# Patient Record
Sex: Male | Born: 1980 | Race: White | Hispanic: No | Marital: Married | Smoking: Never smoker
Health system: Southern US, Community
[De-identification: ages and names within clinical notes are randomized; demographics above are authoritative.]

---

## 2005-02-02 ENCOUNTER — Ambulatory Visit: Payer: Self-pay

## 2007-07-12 IMAGING — CT CT ABD-PELV W/ CM
1 of 2 series · 16 of 32 positions shown, 20 images · non-contrast
Comparison: none

REASON FOR EXAM: LLQ PAIN
COMMENTS:

[Series 2: abdomen · axial · 0.66mm/px · z∈[-619,-211]mm · 16 of 57 slices shown, 20 images]
[im 3/57  soft-tissue]
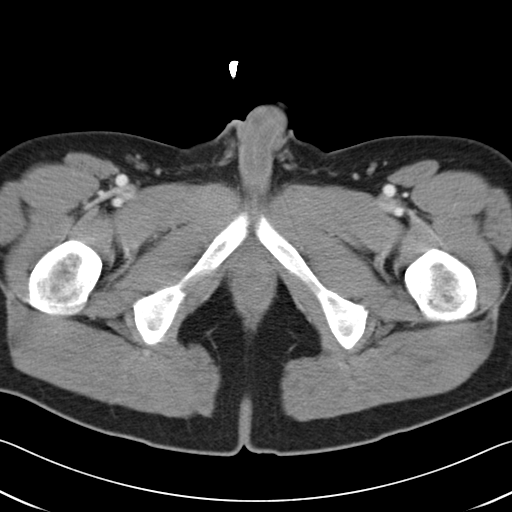
[im 3/57  bone]
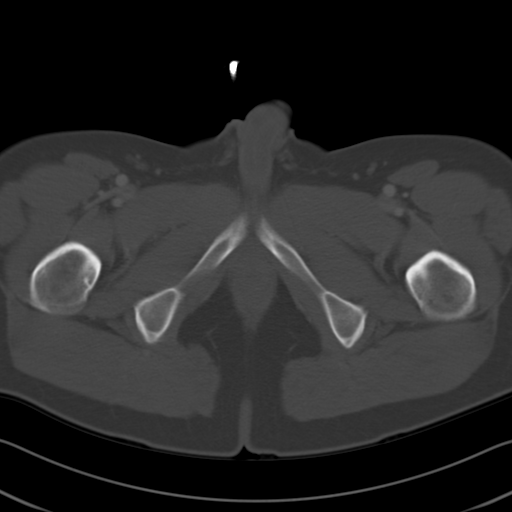
[im 7/57  soft-tissue]
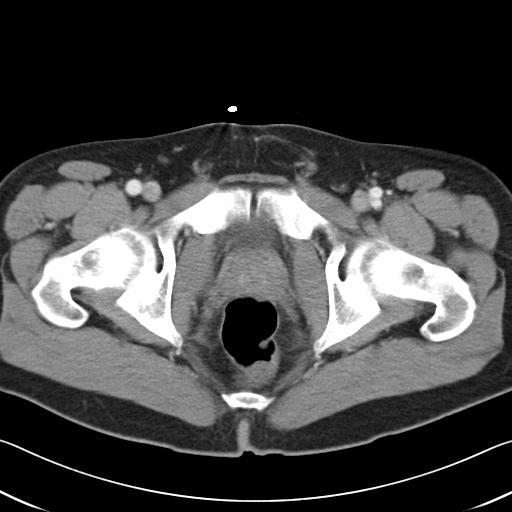
[im 12/57  soft-tissue]
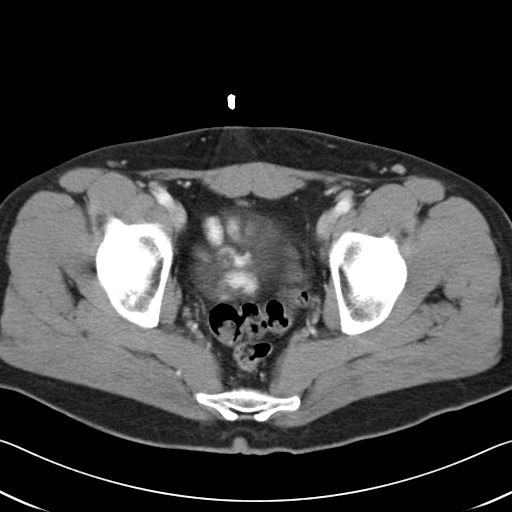
[im 16/57  soft-tissue]
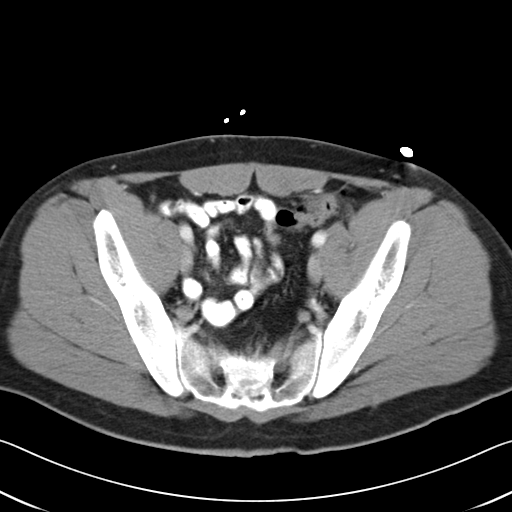
[im 18/57  soft-tissue]
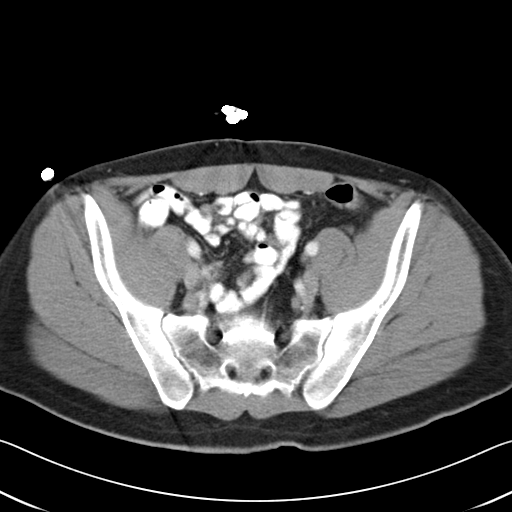
[im 23/57  soft-tissue]
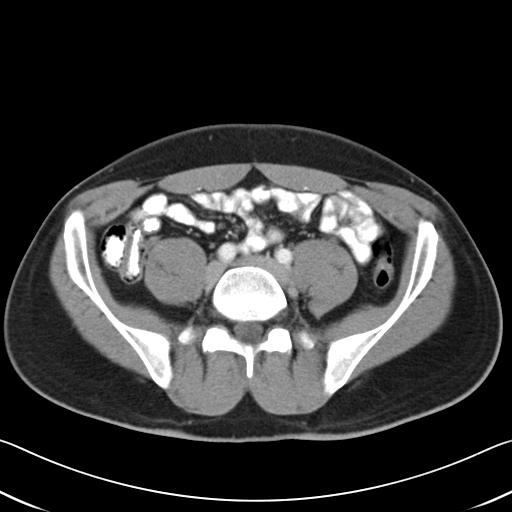
[im 27/57  soft-tissue]
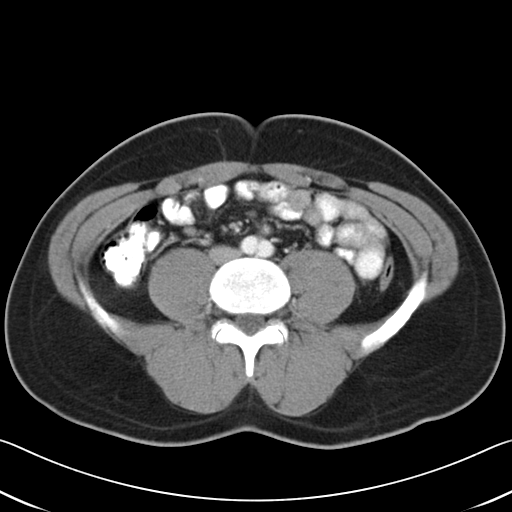
[im 30/57  soft-tissue]
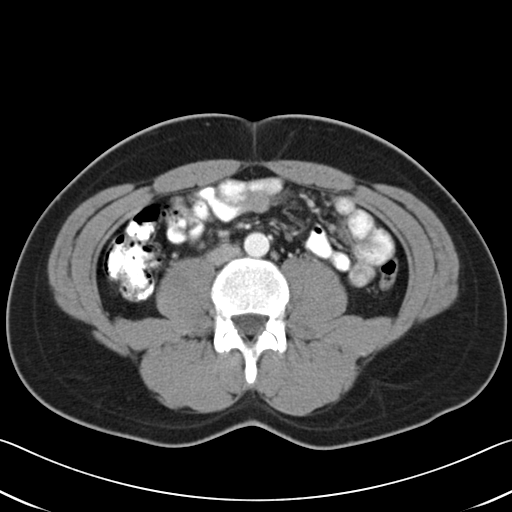
[im 34/57  soft-tissue]
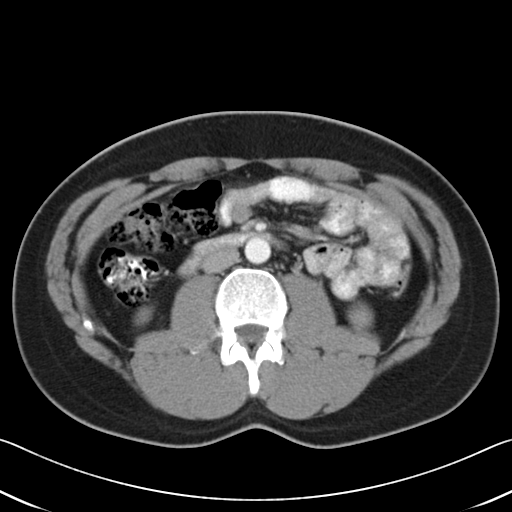
[im 34/57  bone]
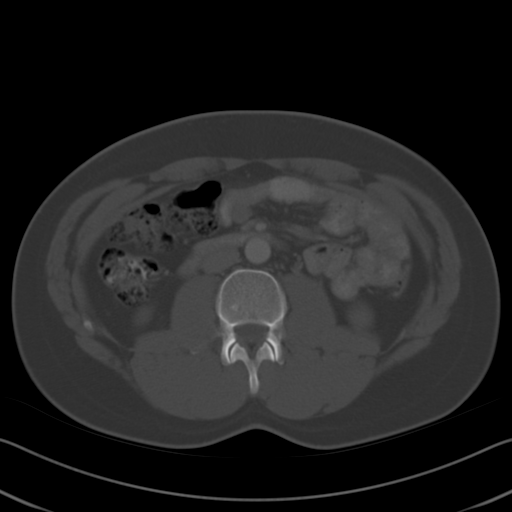
[im 39/57  soft-tissue]
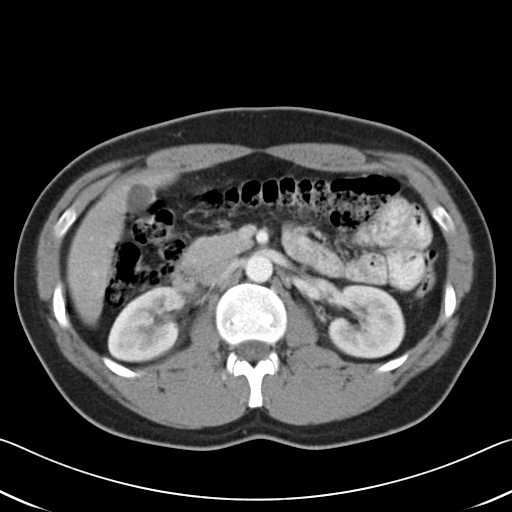
[im 43/57  soft-tissue]
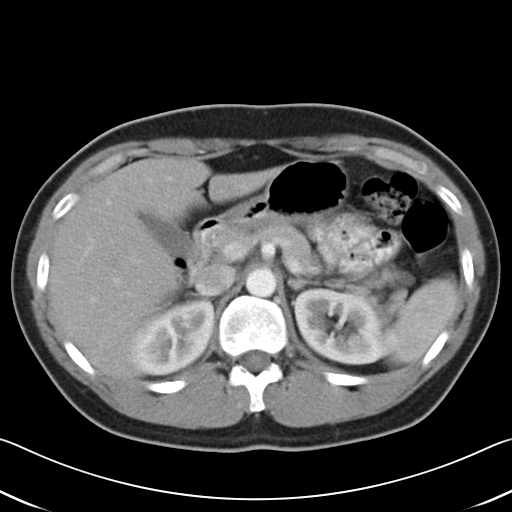
[im 45/57  soft-tissue]
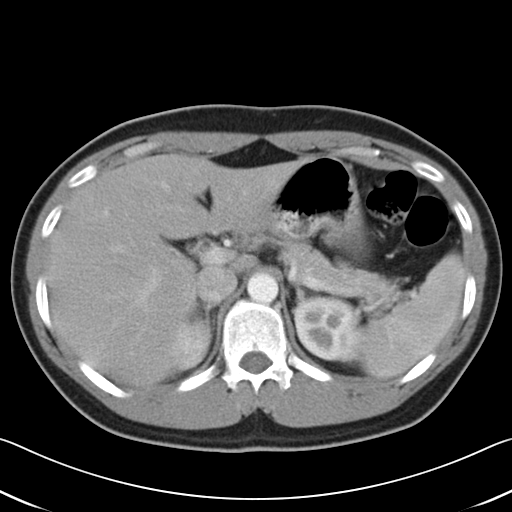
[im 48/57  lung]
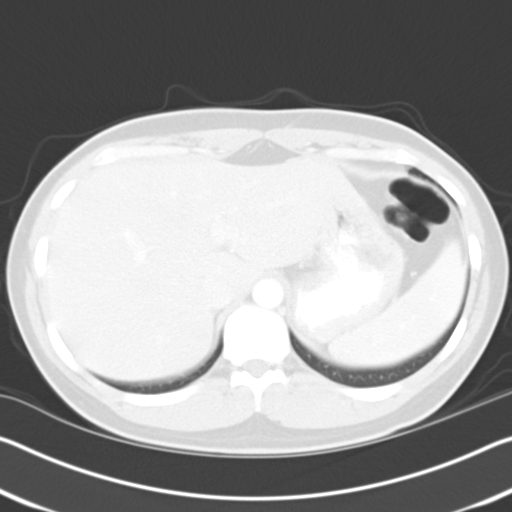
[im 50/57  soft-tissue]
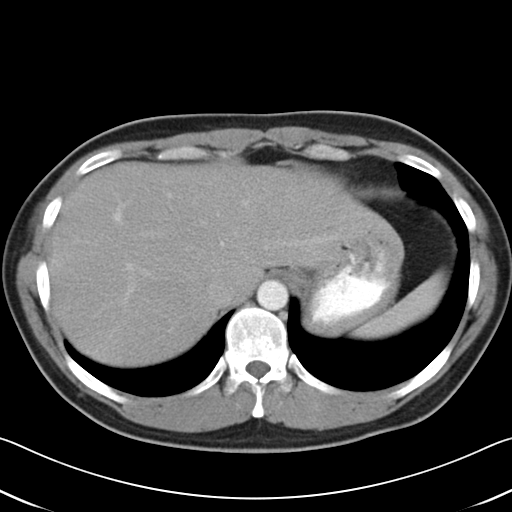
[im 50/57  lung]
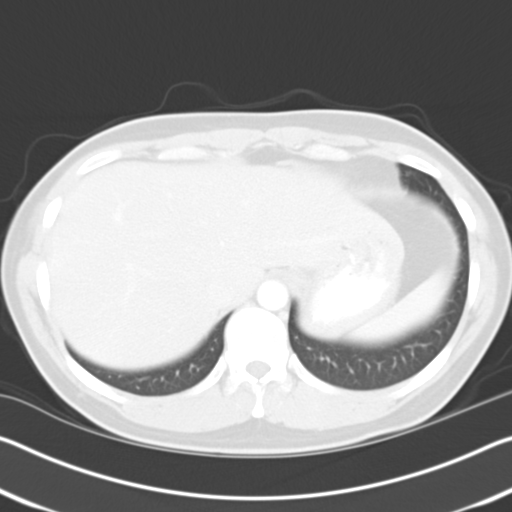
[im 52/57  lung]
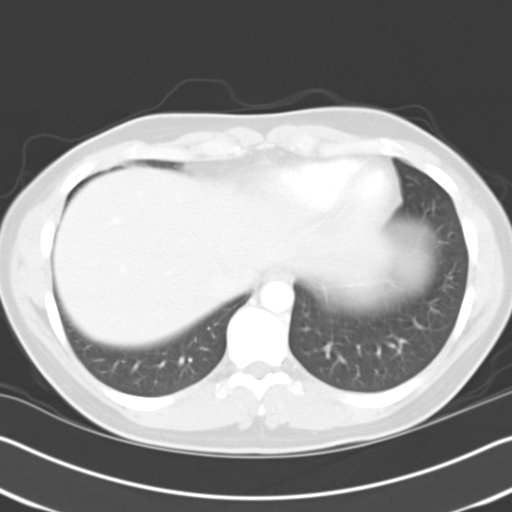
[im 54/57  soft-tissue]
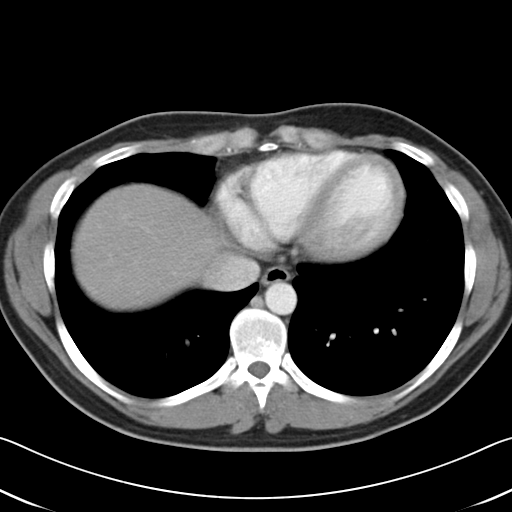
[im 54/57  lung]
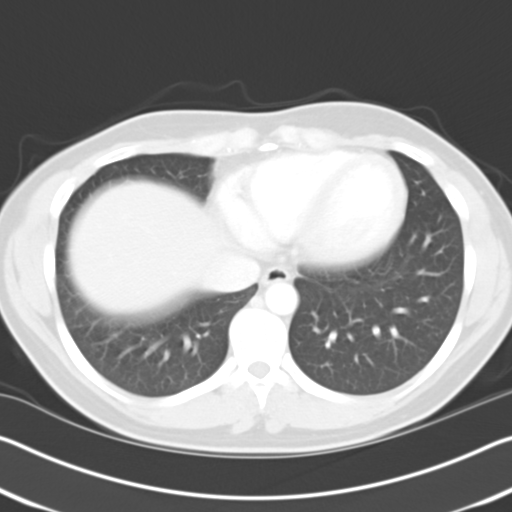

[16 of 32 positions shown; findings below may reference images not displayed]

PROCEDURE:     CT  - CT ABDOMEN / PELVIS  W  - February 02, 2005  [DATE]

RESULT:        IV and oral contrast enhanced CT scan of the abdomen and
pelvis demonstrates the lung bases are clear.  There are no acute
inflammatory changes evident.  The liver and spleen appear to be
unremarkable.   The kidneys enhance normally.  There is no evidence of
pancreatic lesion.  The gallbladder and adrenal glands are unremarkable.  No
abnormal bowel distention or bowel wall thickening is seen.  No abnormal
fluid collections or free fluid are seen.  There is no evidence of free air.
 No inguinal mass or adenopathy is evident.
IMPRESSION: Unremarkable CT of the abdomen and pelvis.

## 2021-10-19 ENCOUNTER — Encounter: Payer: Self-pay | Admitting: Emergency Medicine

## 2021-10-19 ENCOUNTER — Ambulatory Visit
Admission: EM | Admit: 2021-10-19 | Discharge: 2021-10-19 | Disposition: A | Discharge status: Home / Self Care | Payer: Worker's Compensation | Attending: Family Medicine | Admitting: Family Medicine

## 2021-10-19 DIAGNOSIS — M79609 Pain in unspecified limb: Secondary | ICD-10-CM | POA: Diagnosis not present

## 2021-10-19 DIAGNOSIS — S301XXA Contusion of abdominal wall, initial encounter: Secondary | ICD-10-CM

## 2021-10-19 DIAGNOSIS — T1490XA Injury, unspecified, initial encounter: Secondary | ICD-10-CM

## 2021-10-19 DIAGNOSIS — R109 Unspecified abdominal pain: Secondary | ICD-10-CM | POA: Diagnosis not present

## 2021-10-19 DIAGNOSIS — S40811A Abrasion of right upper arm, initial encounter: Secondary | ICD-10-CM

## 2021-10-19 DIAGNOSIS — S80812A Abrasion, left lower leg, initial encounter: Secondary | ICD-10-CM

## 2021-10-19 MED ORDER — NAPROXEN 375 MG PO TABS: 375.0000 mg | ORAL_TABLET | Freq: Two times a day (BID) | ORAL | 0 refills | Status: AC

## 2021-10-19 MED ORDER — METHOCARBAMOL 500 MG PO TABS: 500.0000 mg | ORAL_TABLET | Freq: Two times a day (BID) | ORAL | 0 refills | Status: AC

## 2021-10-19 NOTE — Discharge Instructions (Addendum)
Tylenol for pain as needed.  Stop by the pharmacy to pick up your prescriptions.  Remember that your pain will likely get worse before it gets better.    Follow up with Joanna Hews, NP [address] [place], [place] [address]  Call to make appointment at [phone]  Clinic hours: 8a-5p M-F

## 2021-10-19 NOTE — ED Triage Notes (Signed)
Pt was lifting a garage door at work and it swung back and hit him on his right side. He has an abrasion on his right side abdomen, right side rib pain and right hip pain today.

## 2021-10-19 NOTE — ED Provider Notes (Signed)
[facility]    CSN: [id number] Arrival date & time: 10/19/21  1116      History   Chief Complaint Chief Complaint  Patient presents with   W/C Injury     HPI Marc Rivera is a 41 y.o. male.   HPI  An hour PTA pt was hit by a ~15 ft x12 ft doorgarage door at work. States the rollers came out of the track and the door swung up.  He was able to get up off the ground. He doesn't recall hitting the ground. Denies hitting head. He believes the door scrapped his left leg and then stuck him on the right side. Has pain in his left abdomen. Has a knot and scrap there.  Pain worse with sitting and stretching upward. Endorses pain with movement, abdominal pain, and mild LLE and RUE pain.    History reviewed. No pertinent past medical history.  There are no problems to display for this patient.   History reviewed. No pertinent surgical history.     Home Medications    Prior to Admission medications   Medication Sig Start Date End Date Taking? Authorizing Provider  methocarbamol (ROBAXIN) 500 MG tablet Take 1 tablet (500 mg total) by mouth 2 (two) times daily. 10/19/21  Yes Clive Parcel, DO  naproxen (NAPROSYN) 375 MG tablet Take 1 tablet (375 mg total) by mouth 2 (two) times daily. 10/19/21  Yes Aeryn Medici, DO  omeprazole (PRILOSEC) 10 MG capsule Take by mouth.    [provider]    Family History History reviewed. No pertinent family history.  Social History Social History   Tobacco Use   Smoking status: Never   Smokeless tobacco: Never  Vaping Use   Vaping Use: Never used  Substance Use Topics   Alcohol use: Not Currently   Drug use: Never     Allergies   Patient has no known allergies.   Review of Systems Review of Systems :negative unless otherwise stated in HPI.     Physical Exam Triage Vital Signs ED Triage Vitals [10/19/21 1133]  Enc Vitals Group     BP (!) 143/93     Pulse Rate 71     Resp 15     Temp 98.1 F (36.7 C)      Temp Source Oral     SpO2 100 %     Weight      Height      Head Circumference      Peak Flow      Pain Score 8     Pain Loc      Pain Edu?      Excl. in GC?    No data found.  Updated Vital Signs BP (!) 143/93 (BP Location: Left Arm)   Pulse 71   Temp 98.1 F (36.7 C) (Oral)   Resp 15   SpO2 100%   Visual Acuity Right Eye Distance:   Left Eye Distance:   Bilateral Distance:    Right Eye Near:   Left Eye Near:    Bilateral Near:     Physical Exam  GEN:     alert, uncomfortable appearing male  EYES:   pupils equal and reactive, EOM intact NECK:  supple, normal ROM, no C spine tenderness  RESP:  clear to auscultation bilaterally, no increased work of breathing  CVS:   regular rate and rhythm, distal pulses intact, no rib tenderness  ABD:  soft, left sided abrasion with ~1 cm hematoma  that is tender to palpation, no bleeding towards groin MSK: no T or L spine tenderness, pelvis stable  EXT:   Abrasion to left thight and upper posterior right arm at the axillary crease, normal UE b/l and left LE, decreased right LE ROM secondary to lower abdominal pain, strength 5/5 bilateral UE and LE NEURO:  normal without focal findings, alert and oriented   Skin:   warm and dry, abrasions as pictured below          UC Treatments / Results  Labs (all labs ordered are listed, but only abnormal results are displayed) Labs Reviewed - No data to display  EKG   Radiology No results found.  Procedures Procedures (including critical care time)  Medications Ordered in UC Medications - No data to display  Initial Impression / Assessment and Plan / UC Course  I have reviewed the triage vital signs and the nursing notes.  Pertinent labs & imaging results that were available during my care of the patient were reviewed by me and considered in my medical decision making (see chart for details).  Trauma  Pt presents after workplace injury involving garage door with  abrasions to abdomen with hematoma, RUE and LLE.  Offered pain medication in office and work note however he declined.  Discussed pain course over the next couple of days. Abdominal imaging deferred at this time. Treat with muscle relaxer and anti-inflammatory.  Follow up with Ms Joanna Hews, as needed.       Final Clinical Impressions(s) / UC Diagnoses   Final diagnoses:  Trauma  Abdominal wall hematoma, initial encounter  Abrasion of left lower extremity, initial encounter  Abrasion of right upper arm, initial encounter     Discharge Instructions      Tylenol for pain as needed.  Stop by the pharmacy to pick up your prescriptions.  Remember that your pain will likely get worse before it gets better.    Follow up with Joanna Hews, NP [address] [place], [place] [address]  Call to make appointment at [phone]  Clinic hours: 8a-5p M-F     ED Prescriptions     Medication Sig Dispense Auth. Provider   methocarbamol (ROBAXIN) 500 MG tablet Take 1 tablet (500 mg total) by mouth 2 (two) times daily. 20 tablet Weaver Tweed, DO   naproxen (NAPROSYN) 375 MG tablet Take 1 tablet (375 mg total) by mouth 2 (two) times daily. 20 tablet Katha Cabal, DO      PDMP not reviewed this encounter.   Katha Cabal, DO 10/19/21 1243
